# Patient Record
Sex: Female | Born: 2000
Health system: Southern US, Community
[De-identification: ages and names within clinical notes are randomized; demographics above are authoritative.]

---
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
## 2020-01-29 DIAGNOSIS — L700 Acne vulgaris: Secondary | ICD-10-CM | POA: Diagnosis not present

## 2020-03-11 DIAGNOSIS — L700 Acne vulgaris: Secondary | ICD-10-CM | POA: Diagnosis not present

## 2020-03-18 ENCOUNTER — Telehealth: Payer: Self-pay

## 2020-03-18 ENCOUNTER — Other Ambulatory Visit: Payer: Self-pay

## 2020-03-18 ENCOUNTER — Encounter: Payer: Self-pay | Admitting: Family

## 2020-03-18 ENCOUNTER — Ambulatory Visit (INDEPENDENT_AMBULATORY_CARE_PROVIDER_SITE_OTHER): Payer: BC Managed Care – PPO | Admitting: Family

## 2020-03-18 VITALS — BP 122/80 | HR 90 | Temp 98.1°F | Ht 63.75 in | Wt 104.4 lb

## 2020-03-18 DIAGNOSIS — S069X9S Unspecified intracranial injury with loss of consciousness of unspecified duration, sequela: Secondary | ICD-10-CM | POA: Diagnosis not present

## 2020-03-18 DIAGNOSIS — Z7689 Persons encountering health services in other specified circumstances: Secondary | ICD-10-CM

## 2020-03-18 DIAGNOSIS — Z23 Encounter for immunization: Secondary | ICD-10-CM

## 2020-03-18 LAB — COMPREHENSIVE METABOLIC PANEL
ALT: 14 U/L (ref 0–35)
AST: 20 U/L (ref 0–37)
Albumin: 4.8 g/dL (ref 3.5–5.2)
Alkaline Phosphatase: 81 U/L (ref 47–119)
BUN: 13 mg/dL (ref 6–23)
CO2: 27 mEq/L (ref 19–32)
Calcium: 10.2 mg/dL (ref 8.4–10.5)
Chloride: 103 mEq/L (ref 96–112)
Creatinine, Ser: 0.68 mg/dL (ref 0.40–1.20)
GFR: 126.24 mL/min (ref >60.00)
Glucose, Bld: 82 mg/dL (ref 70–99)
Potassium: 4.2 mEq/L (ref 3.5–5.1)
Sodium: 138 mEq/L (ref 135–145)
Total Bilirubin: 0.5 mg/dL (ref 0.2–1.2)
Total Protein: 6.9 g/dL (ref 6.0–8.3)

## 2020-03-18 LAB — TSH: TSH: 1.33 u[IU]/mL (ref 0.40–5.00)

## 2020-03-18 NOTE — Assessment & Plan Note (Addendum)
Reviewed history. We will request vaccination records from pediatrician. Unsure why thyroid US ordered 2 years ago. Pending TSH and baseline renal, liver function.

## 2020-03-18 NOTE — Telephone Encounter (Signed)
Patient had lab drawn today and got light-headed and dizzy. Patient recovered quickly once a ice pack was placed on her head and some crackers were given to eat. Please see the additional vitals that were added after her visit in the lab.

## 2020-03-18 NOTE — Progress Notes (Signed)
I have abstracted from [facility].

## 2020-03-18 NOTE — Assessment & Plan Note (Addendum)
Symptoms resolved. Will follow
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
## 2020-03-18 NOTE — Telephone Encounter (Signed)
Im so sorry I thought I put them in correctly but her vitals will be listed below.  -90 pulse -122/80 bp -96 % O2

## 2020-03-18 NOTE — Telephone Encounter (Signed)
noted 

## 2020-03-18 NOTE — Telephone Encounter (Signed)
Daisy Mills, I dont see vitals that were taken.   Can you send them to me?

## 2020-03-18 NOTE — Patient Instructions (Signed)
Nice to meet you!

## 2020-03-19 ENCOUNTER — Telehealth: Payer: Self-pay

## 2020-03-19 LAB — HIV ANTIBODY (ROUTINE TESTING W REFLEX): HIV 1&2 Ab, 4th Generation: NONREACTIVE

## 2020-03-19 LAB — HEPATITIS C ANTIBODY
Hepatitis C Ab: NONREACTIVE
SIGNAL TO CUT-OFF: 0.01 (ref <1.00)

## 2020-03-19 NOTE — Telephone Encounter (Signed)
Patient called & informed of results. Pt had no further questions.

## 2020-03-19 NOTE — Telephone Encounter (Signed)
LMTCB for lab results.  

## 2020-03-19 NOTE — Telephone Encounter (Signed)
Pt returned your call.  

## 2020-04-22 DIAGNOSIS — H16101 Unspecified superficial keratitis, right eye: Secondary | ICD-10-CM | POA: Diagnosis not present

## 2020-06-11 DIAGNOSIS — L700 Acne vulgaris: Secondary | ICD-10-CM | POA: Diagnosis not present

## 2020-09-12 ENCOUNTER — Other Ambulatory Visit: Payer: Self-pay

## 2020-09-12 ENCOUNTER — Ambulatory Visit
Admission: EM | Admit: 2020-09-12 | Discharge: 2020-09-12 | Disposition: A | Discharge status: Home / Self Care | Payer: BC Managed Care – PPO | Attending: Family Medicine | Admitting: Family Medicine

## 2020-09-12 DIAGNOSIS — R059 Cough, unspecified: Secondary | ICD-10-CM | POA: Diagnosis not present

## 2020-09-12 DIAGNOSIS — R0982 Postnasal drip: Secondary | ICD-10-CM

## 2020-09-12 MED ORDER — CETIRIZINE HCL 10 MG PO TABS: 10.0000 mg | ORAL_TABLET | Freq: Every day | ORAL | 1 refills | Status: AC

## 2020-09-12 MED ORDER — BENZONATATE 200 MG PO CAPS: 200.0000 mg | ORAL_CAPSULE | Freq: Three times a day (TID) | ORAL | 0 refills | Status: AC | PRN

## 2020-09-12 NOTE — Discharge Instructions (Signed)
Medication as prescribed.  If persists, follow up with your Primary care office.  Take care  Dr. Adriana Simas
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
## 2020-09-12 NOTE — ED Triage Notes (Addendum)
Pt c/o cough for about 2 weeks. Pt denies any other current symptoms. She reports she did have some sneezing in the beginning. Pt denies shob, wheeze, f/n/v/d or other symptoms. Pt has used Delsym and it has helped some but cough is still present.

## 2020-09-16 ENCOUNTER — Encounter: Payer: BC Managed Care – PPO | Admitting: Family

## 2020-09-19 ENCOUNTER — Emergency Department
Admission: EM | Admit: 2020-09-19 | Discharge: 2020-09-19 | Disposition: A | Discharge status: Home / Self Care | Payer: BC Managed Care – PPO | Attending: Emergency Medicine | Admitting: Emergency Medicine

## 2020-09-19 ENCOUNTER — Other Ambulatory Visit: Payer: Self-pay

## 2020-09-19 ENCOUNTER — Emergency Department: Payer: BC Managed Care – PPO

## 2020-09-19 ENCOUNTER — Telehealth: Payer: Self-pay | Admitting: Family

## 2020-09-19 DIAGNOSIS — R059 Cough, unspecified: Secondary | ICD-10-CM

## 2020-09-19 DIAGNOSIS — J40 Bronchitis, not specified as acute or chronic: Secondary | ICD-10-CM | POA: Diagnosis not present

## 2020-09-19 DIAGNOSIS — T7840XA Allergy, unspecified, initial encounter: Secondary | ICD-10-CM | POA: Diagnosis not present

## 2020-09-19 MED ORDER — PREDNISONE 10 MG PO TABS: ORAL_TABLET | ORAL | 0 refills | Status: AC

## 2020-09-19 MED ORDER — GUAIFENESIN-CODEINE 100-10 MG/5ML PO SOLN
5.0000 mL | Freq: Once | ORAL | Status: AC
  Administered 2020-09-19: 5 mL via ORAL
  Filled 2020-09-19: qty 5

## 2020-09-19 MED ORDER — GUAIFENESIN-CODEINE 100-10 MG/5ML PO SOLN: 10.0000 mL | Freq: Three times a day (TID) | ORAL | 0 refills | Status: AC | PRN

## 2020-09-19 MED ORDER — ALBUTEROL SULFATE HFA 108 (90 BASE) MCG/ACT IN AERS: 2.0000 | INHALATION_SPRAY | Freq: Four times a day (QID) | RESPIRATORY_TRACT | 2 refills | Status: AC | PRN

## 2020-09-19 MED ORDER — PREDNISONE 20 MG PO TABS
60.0000 mg | ORAL_TABLET | Freq: Once | ORAL | Status: AC
  Administered 2020-09-19: 60 mg via ORAL
  Filled 2020-09-19: qty 3
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
## 2020-09-19 NOTE — Telephone Encounter (Signed)
PT and mom called back wanting to get more info about what to do after being transfer to access nurse. When PT eats/drinks she is having chest pains and feels like something is getting stuck in throat and feels like severe indigestion.

## 2020-09-19 NOTE — Telephone Encounter (Signed)
Patient 's mother called about her daughter having pain in chest was given to Access nurse to triage after taking medication Dr.Cook gave her

## 2020-09-19 NOTE — Telephone Encounter (Signed)
I spoke with patient after speaking with access nurse & she wanted to make sure what she should tell ED. I told her to tell them ALL her symptoms that she feels since taking medications zyrtec & tessalon pearles that she has CP as well as trouble swallowing. Since these are not common side effects it was best that she be evaluated at ED. Pt scheduled 10/04/20 with PCP Rennie Plowman.

## 2020-09-19 NOTE — ED Triage Notes (Signed)
Pt comes with c/o possible allergic reaction. Pt states couple days ago she started meds and since has had some trouble swallowing. Pt states she was taking Benzonatate and Cetirizine.  Pt speaking in complete sentences with no difficulty. Pt states she  Thinks it is the meds.

## 2020-09-19 NOTE — Discharge Instructions (Signed)
Please switch to Claritin allergy. You have also been prescribed a steroid taper, inhaler and cough syrup. Please use as directed. Follow up with primary care or return to ER with any worsening.

## 2020-09-26 ENCOUNTER — Other Ambulatory Visit: Payer: Self-pay

## 2020-10-04 ENCOUNTER — Encounter: Payer: BC Managed Care – PPO | Admitting: Family

## 2020-11-13 ENCOUNTER — Encounter: Payer: Self-pay | Admitting: Family

## 2020-11-15 ENCOUNTER — Encounter: Payer: Self-pay | Admitting: Family

## 2020-12-09 DIAGNOSIS — L700 Acne vulgaris: Secondary | ICD-10-CM | POA: Diagnosis not present

## 2021-09-01 ENCOUNTER — Other Ambulatory Visit: Payer: Self-pay

## 2021-09-01 ENCOUNTER — Ambulatory Visit
Admission: EM | Admit: 2021-09-01 | Discharge: 2021-09-01 | Disposition: A | Discharge status: Home / Self Care | Payer: BC Managed Care – PPO | Attending: Emergency Medicine | Admitting: Emergency Medicine

## 2021-09-01 DIAGNOSIS — J029 Acute pharyngitis, unspecified: Secondary | ICD-10-CM | POA: Diagnosis not present

## 2021-09-01 LAB — GROUP A STREP BY PCR: Group A Strep by PCR: NOT DETECTED
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
## 2021-09-01 NOTE — ED Triage Notes (Signed)
Pt reports lefts sided sore throat and swelling. Pain is worse when swallowing. Ibuprofen gives some relief.  ?

## 2021-09-01 NOTE — Discharge Instructions (Addendum)
Gargle with warm salt water 2-3 times a day to soothe your throat, aid in pain relief, and aid in healing.  Take over-the-counter ibuprofen according to the package instructions as needed for pain.  You can also use Chloraseptic or Sucrets lozenges, 1 lozenge every 2 hours as needed for throat pain.  If you develop any new or worsening symptoms return for reevaluation.  

## 2022-05-21 ENCOUNTER — Ambulatory Visit: Admit: 2022-05-21 | Discharge status: Against Medical Advice | Payer: Self-pay

## 2022-05-21 DIAGNOSIS — Z20822 Contact with and (suspected) exposure to covid-19: Secondary | ICD-10-CM | POA: Diagnosis not present

## 2022-05-21 DIAGNOSIS — R509 Fever, unspecified: Secondary | ICD-10-CM | POA: Diagnosis not present

## 2022-05-21 DIAGNOSIS — J101 Influenza due to other identified influenza virus with other respiratory manifestations: Secondary | ICD-10-CM | POA: Diagnosis not present

## 2023-01-20 ENCOUNTER — Telehealth: Payer: Self-pay | Admitting: Family

## 2023-01-20 NOTE — Telephone Encounter (Signed)
Called patient and left VM to schedule next available CPE appt.
